# Patient Record
Sex: Male | Born: 1960 | Race: White | Hispanic: No | Smoking: Former smoker
Health system: Southern US, Community
[De-identification: ages and names within clinical notes are randomized; demographics above are authoritative.]

## PROBLEM LIST (undated history)

## (undated) DIAGNOSIS — J449 Chronic obstructive pulmonary disease, unspecified: Secondary | ICD-10-CM

## (undated) DIAGNOSIS — Z972 Presence of dental prosthetic device (complete) (partial): Secondary | ICD-10-CM

## (undated) DIAGNOSIS — I10 Essential (primary) hypertension: Secondary | ICD-10-CM

## (undated) DIAGNOSIS — M1990 Unspecified osteoarthritis, unspecified site: Secondary | ICD-10-CM

## (undated) HISTORY — PX: CATARACT EXTRACTION: SUR2

---

## 2007-04-27 ENCOUNTER — Emergency Department: Payer: Self-pay | Admitting: Internal Medicine

## 2008-02-03 ENCOUNTER — Emergency Department: Payer: Self-pay

## 2008-05-07 ENCOUNTER — Emergency Department: Payer: Self-pay | Admitting: Internal Medicine

## 2009-10-24 ENCOUNTER — Emergency Department: Payer: Self-pay | Admitting: Emergency Medicine

## 2011-03-24 ENCOUNTER — Emergency Department: Payer: Self-pay | Admitting: *Deleted

## 2011-03-26 ENCOUNTER — Emergency Department: Payer: Self-pay | Admitting: Emergency Medicine

## 2012-08-31 ENCOUNTER — Emergency Department: Payer: Self-pay | Admitting: Unknown Physician Specialty

## 2012-09-29 ENCOUNTER — Ambulatory Visit: Payer: Self-pay | Admitting: Anesthesiology

## 2012-10-02 ENCOUNTER — Ambulatory Visit: Payer: Self-pay | Admitting: Emergency Medicine

## 2012-11-04 ENCOUNTER — Emergency Department: Payer: Self-pay | Admitting: Emergency Medicine

## 2013-04-06 ENCOUNTER — Ambulatory Visit: Payer: Self-pay | Admitting: Gastroenterology

## 2013-09-21 ENCOUNTER — Ambulatory Visit: Payer: Self-pay | Admitting: Specialist

## 2013-09-21 LAB — APTT: Activated PTT: 29.1 secs (ref 23.6–35.9)

## 2013-09-21 LAB — URINALYSIS, COMPLETE
BLOOD: NEGATIVE
Bacteria: NONE SEEN
Bilirubin,UR: NEGATIVE
GLUCOSE, UR: NEGATIVE mg/dL (ref 0–75)
Ketone: NEGATIVE
LEUKOCYTE ESTERASE: NEGATIVE
Nitrite: NEGATIVE
PROTEIN: NEGATIVE
Ph: 6 (ref 4.5–8.0)
RBC,UR: 1 /HPF (ref 0–5)
Specific Gravity: 1.02 (ref 1.003–1.030)
Squamous Epithelial: NONE SEEN

## 2013-09-21 LAB — PROTIME-INR
INR: 1
PROTHROMBIN TIME: 13.3 s (ref 11.5–14.7)

## 2013-09-22 LAB — MRSA PCR SCREENING

## 2013-10-13 ENCOUNTER — Inpatient Hospital Stay: Payer: Self-pay | Admitting: Specialist

## 2013-10-14 LAB — HEMOGLOBIN: HGB: 13.7 g/dL (ref 13.0–18.0)

## 2013-10-14 LAB — CREATININE, SERUM: CREATININE: 0.82 mg/dL (ref 0.60–1.30)

## 2014-08-23 ENCOUNTER — Ambulatory Visit: Payer: Self-pay | Admitting: Gastroenterology

## 2014-10-08 NOTE — Consult Note (Signed)
PATIENT NAME:  Richard Gutierrez, Richard Gutierrez MR#:  [id number] DATE OF BIRTH:  August 29, 1960  DATE OF CONSULTATION:  11/04/2012  CONSULTING PHYSICIAN:  Delshawn Stech S. Phylis Bougie, MD  HISTORY OF PRESENT ILLNESS:  This patient was seen by me in the Emergency Room at request of Emergency Room physician. The patient came in with cellulitis and infection of  the umbilical area for the last 4 or 5 days. This patient had surgery performed for ventral hernia, which I put a mesh in over 3 weeks ago, and he was fine, so his stitches out. Then for the last 4 or 5 days, he was getting some increasing redness and swelling and some drainage, and he came to the Emergency Room. The emergency doctor called me because of the danger of infection of the mesh. I went and saw the patient. The patient did have cellulitis. I tried to drain the abscess. There was no fluid. I did the cultures and I put this patient on antibiotics, Bactrim DS twice a day.  I told him to come back and see me in the office.   ____________________________ Welford Roche. Phylis Bougie, MD msh:dmm D: 11/11/2012 10:58:00 ET T: 11/11/2012 11:24:24 ET JOB#: [id number]  cc: Perri Aragones S. Phylis Bougie, MD, <Dictator> Sharene Butters MD ELECTRONICALLY SIGNED 11/12/2012 11:45

## 2014-10-08 NOTE — Op Note (Signed)
PATIENT NAME:  Richard Gutierrez, Richard Gutierrez MR#:  [id number] DATE OF BIRTH:  01-03-1961  DATE OF PROCEDURE:  10/02/2012  PREOPERATIVE DIAGNOSIS:  Incarcerated ventral hernia.   POSTOPERATIVE DIAGNOSIS: Incarcerated ventral hernia.   PROCEDURE:  Repair of incarcerated ventral hernia with mesh.   SURGEON:  Vella Kohler, MD  INDICATION:  This patient was seen by me with a large incarcerated hernia, in the middle of his abdomen. He is an obese gentleman. He also has COPD.    DESCRIPTION OF PROCEDURE:  The patient was brought to surgery. Under general anesthesia, the abdomen was then prepped and draped. A small incision was made under the skin. After cutting skin and subcutaneous tissue, the hernia sac was then dissected off. The patient had a piece of omentum stuck through it. It was then dissected off, suture ligated and sent and pushed back into the abdomen. After that adhesions from the inside of the abdominal wall were then released to put mesh inside and then we put in a circular mesh in and sutured all across and sutured the fascia with interrupted #1 Surgilon.  The subcutaneous tissue was then closed with 3-0 Vicryl. The skin was closed with skin sutures. The patient tolerated the procedure well and was sent to the recovery room in satisfactory condition.  ____________________________ Welford Roche Phylis Bougie, MD msh:sb D: 10/02/2012 12:03:11 ET T: 10/02/2012 12:19:50 ET JOB#: [id number]  cc: Aviva Wolfer S. Phylis Bougie, MD, <Dictator> Sarah "Baltazar Apo, MD Sharene Butters MD ELECTRONICALLY SIGNED 10/16/2012 14:00

## 2014-10-09 NOTE — Op Note (Signed)
PATIENT NAME:  Richard Gutierrez, Richard Gutierrez MR#:  [id number] DATE OF BIRTH:  03/14/1961  DATE OF OPERATION:  10/13/2013  PREOPERATIVE DIAGNOSIS: Severe degenerative arthritis, left knee.   POSTOPERATIVE DIAGNOSIS: Severe degenerative arthritis, left knee.   PROCEDURE: LCS left total knee replacement.   SURGEON: Christophe Louis, M.D.   ANESTHESIA: Spinal.   COMPLICATIONS: None.   TOURNIQUET TIME: 115 minutes.   ESTIMATED BLOOD LOSS: 75 mL.   DRAINS: Two Autovac.   DESCRIPTION OF PROCEDURE: Clindamycin 600 mg is given intravenously. Spinal anesthesia is induced. Tourniquet is applied to the left leg. The left leg is thoroughly prepped with alcohol and ChloraPrep, and draped in standard sterile fashion. The extremity is wrapped out with the Esmarch bandage, and pneumatic tourniquet elevated to 350 mmHg. A standard anterior longitudinal incision is made for total knee replacement, and the dissection carried down to the patella, and the medial and lateral retinaculum is dissected out. Medial parapatellar incision is then made, and the knee is flexed and the patella reflected laterally. Standard synovial and osteophyte debridement is performed. Retractors are placed about the proximal tibia, and the proximal tibial cutter is put into place. This is checked with the alignment guide and seen to be satisfactory. Proximal tibial cut is then made. Posterior meniscus debridement is performed. The distal femur is sized as a standard plus. Central drill hole is then made using the guide. The central peg is then inserted into the distal femur. Femoral rotation guide is then put into place and is seen to be a good fit at 10 mm. The cutting block is pinned, and the anterior and posterior cuts are made. The 10 mm flexion block is put into place, and is seen to be stable, with excellent alignment. The 5-degree valgus distal femoral cutting block is then impacted into place, and then pinned. This is cut off at the appropriate  angle. The extension block is then put into place, and also seen to be in good position, but somewhat tight. One mm more of distal femur was taken off, and the ligament tightness and position of the block was excellent. This is removed, and the femoral shaping guide is used to drill the holes and remove the appropriate portions of bone. Retractors are then placed, revealing the proximal tibia, and the +3 tibial guide is impacted into place. A central drill hole is made. Pins are removed, and the central plug placed. The 10 mm rotating platform trial is inserted, along with the standard plus femur. There is seen to be full extension and flexion, without any particular problems. The patella is prepared in standard fashion for a press-fit patella. All trial components are then removed. The joint was thoroughly irrigated multiple times throughout the case. Final pulsatile lavage was performed. The +3 metallic tibial tray is then cemented into place along with a rotating platform 10 mm polyethylene component. The standard plus porous-coated femoral component is impacted into place with the small amount of cement in the intercondylar notch. The patella was press-fit. Final position of all components is excellent, with a good range of motion. The wound is thoroughly irrigated multiple times. The periarticular tissues are then infiltrated with a mixture of plain Marcaine, 4 mg of morphine, and Toradol. Two Autovac drains were brought out through separate stab wound incisions. The retinaculum is tightly repaired using #2 Ethibond. Subcutaneous tissue is closed with 2-0 Vicryl. Skin is closed with the skin stapler. A soft bulky dressing with Polar Care and a knee immobilizer is applied.  The tourniquet is released. The patient is returned to the recovery room in satisfactory condition, having tolerated the procedure quite well.     ____________________________ Lucas Mallow, MD ces:mr D: 10/13/2013 17:05:57  ET T: 10/13/2013 19:30:05 ET JOB#: [id number]  cc: Lucas Mallow, MD, <Dictator> Lucas Mallow MD ELECTRONICALLY SIGNED 10/15/2013 15:37

## 2014-10-09 NOTE — Discharge Summary (Signed)
PATIENT NAME:  Richard Gutierrez, Richard Gutierrez MR#:  [id number] DATE OF BIRTH:  1960/11/16  DATE OF ADMISSION:  10/13/2013 DATE OF DISCHARGE:  10/17/2013  DISCHARGE DIAGNOSIS: Severe degenerative arthritis, left knee.   OPERATIONS OR PROCEDURES PERFORMED: Left total knee arthroplasty on 10/13/2013.   HISTORY AND PHYSICAL EXAMINATION: As written on admission.   LABORATORY DATA: As noted in the chart.   HOSPITAL COURSE: Following appropriate informed consent and laboratory evaluation, the patient was taken to the Operating Room on 10/13/2013 where a left LCS total hip arthroplasty was performed without difficulty. The patient tolerated the procedure quite well and had a benign postoperative course. His Hemovac was removed on day number one and physical therapy was begun. Range of motion was begun at the same time. He was advanced in a walker with weight-bearing as tolerated. He progressed and his wound was seen to be healing in quite well. By 05/02/20105 it was felt that he could be safely discharged to his home on his previous home medications, plus Dilaudid 2 mg every three hours as necessary for pain and also enteric-coated aspirin 1 tablet 325 mg twice a day to be taken with food. Home health physical therapy was prescribed. He is to return to the office in 10 days for staple removal.   ____________________________ Lucas Mallow, MD ces:sg D: 11/04/2013 13:52:38 ET T: 11/04/2013 14:19:01 ET JOB#: [id number]  cc: Lucas Mallow, MD, <Dictator> Lucas Mallow MD ELECTRONICALLY SIGNED 11/07/2013 13:33

## 2017-08-12 ENCOUNTER — Other Ambulatory Visit: Payer: Self-pay

## 2017-08-12 ENCOUNTER — Encounter: Payer: Self-pay | Admitting: Emergency Medicine

## 2017-08-12 ENCOUNTER — Emergency Department
Admission: EM | Admit: 2017-08-12 | Discharge: 2017-08-12 | Disposition: A | Discharge status: Home / Self Care | Payer: Medicare HMO | Attending: Emergency Medicine | Admitting: Emergency Medicine

## 2017-08-12 DIAGNOSIS — Z79899 Other long term (current) drug therapy: Secondary | ICD-10-CM | POA: Insufficient documentation

## 2017-08-12 DIAGNOSIS — I10 Essential (primary) hypertension: Secondary | ICD-10-CM | POA: Insufficient documentation

## 2017-08-12 DIAGNOSIS — L02412 Cutaneous abscess of left axilla: Secondary | ICD-10-CM | POA: Diagnosis not present

## 2017-08-12 DIAGNOSIS — J449 Chronic obstructive pulmonary disease, unspecified: Secondary | ICD-10-CM | POA: Diagnosis not present

## 2017-08-12 DIAGNOSIS — Z87891 Personal history of nicotine dependence: Secondary | ICD-10-CM | POA: Diagnosis not present

## 2017-08-12 HISTORY — DX: Chronic obstructive pulmonary disease, unspecified: J44.9

## 2017-08-12 HISTORY — DX: Essential (primary) hypertension: I10

## 2017-08-12 MED ORDER — OXYCODONE HCL 5 MG PO TABS
5.0000 mg | ORAL_TABLET | Freq: Once | ORAL | Status: AC
  Administered 2017-08-12: 5 mg via ORAL
  Filled 2017-08-12: qty 1

## 2017-08-12 MED ORDER — SULFAMETHOXAZOLE-TRIMETHOPRIM 800-160 MG PO TABS: 1.0000 | ORAL_TABLET | Freq: Two times a day (BID) | ORAL | 0 refills | Status: DC

## 2017-08-12 MED ORDER — LIDOCAINE HCL (PF) 1 % IJ SOLN
5.0000 mL | Freq: Once | INTRAMUSCULAR | Status: AC
  Administered 2017-08-12: 5 mL via INTRADERMAL
  Filled 2017-08-12: qty 5

## 2017-08-12 MED ORDER — HYDROCODONE-ACETAMINOPHEN 5-325 MG PO TABS: 1.0000 | ORAL_TABLET | ORAL | 0 refills | Status: AC | PRN

## 2017-08-12 NOTE — ED Notes (Signed)
See triage note  Presents with a possible abscess area under left arm for about 3-4 days  Increased pain this am

## 2017-08-12 NOTE — Discharge Instructions (Signed)
Return to the ER in 2-3 days if not improving.

## 2017-08-12 NOTE — ED Provider Notes (Signed)
[facility] Emergency Department Provider Note  ____________________________________________  Time seen: Approximately 8:52 AM  I have reviewed the triage vital signs and the nursing notes.   HISTORY  Chief Complaint Abscess   HPI Richard Gutierrez is a 57 y.o. male who presents to the emergency department for treatment and evaluation of an abscess under the left axilla x 3 days.He has had an abscess under the right arm several years ago that required I&D, but none since. Pain has increased over the past 24 hours. No known fever. No alleviating measures have been attempted.  Past Medical History:  Diagnosis Date  . COPD (chronic obstructive pulmonary disease) ([place])   . Hypertension     There are no active problems to display for this patient.   Past Surgical History:  Procedure Laterality Date  . CATARACT EXTRACTION      Prior to Admission medications   Medication Sig Start Date End Date Taking? Authorizing Provider  albuterol (PROVENTIL HFA;VENTOLIN HFA) 108 (90 Base) MCG/ACT inhaler Inhale 2 puffs into the lungs every 6 (six) hours as needed for wheezing or shortness of breath.   Yes [provider]  atorvastatin (LIPITOR) 20 MG tablet Take 20 mg by mouth daily.   Yes [provider]  Fluticasone-Salmeterol (ADVAIR) 250-50 MCG/DOSE AEPB Inhale 1 puff into the lungs 2 (two) times daily.   Yes [provider]  lisinopril (PRINIVIL,ZESTRIL) 20 MG tablet Take 20 mg by mouth daily.   Yes [provider]  meloxicam (MOBIC) 7.5 MG tablet Take 7.5 mg by mouth daily.   Yes [provider]  tiotropium (SPIRIVA) 18 MCG inhalation capsule Place 18 mcg into inhaler and inhale daily.   Yes [provider]  HYDROcodone-acetaminophen (NORCO/VICODIN) 5-325 MG tablet Take 1 tablet by mouth every 4 (four) hours as needed for moderate pain. 08/12/17 08/12/18  Seyon Strader, Johnette Abraham B, FNP  sulfamethoxazole-trimethoprim (BACTRIM  DS,SEPTRA DS) 800-160 MG tablet Take 1 tablet by mouth 2 (two) times daily. 08/12/17   Victorino Dike, FNP    Allergies Patient has no known allergies.  No family history on file.  Social History Social History   Tobacco Use  . Smoking status: Former Research scientist (life sciences)  . Smokeless tobacco: Never Used  Substance Use Topics  . Alcohol use: No    Frequency: Never  . Drug use: Not on file    Review of Systems  Constitutional: Negative for fever. Respiratory: Negative for cough or shortness of breath.  Musculoskeletal: Negataive for myalgias Skin: Positive for abscess under the left axilla. Neurological: Positive for numbness or paresthesias. ____________________________________________   PHYSICAL EXAM:  VITAL SIGNS: ED Triage Vitals  Enc Vitals Group     BP 08/12/17 0839 (!) 149/74     Pulse Rate 08/12/17 0839 77     Resp 08/12/17 0839 18     Temp 08/12/17 0839 98 F (36.7 C)     Temp Source 08/12/17 0839 Oral     SpO2 08/12/17 0839 100 %     Weight 08/12/17 0840 280 lb (127 kg)     Height 08/12/17 0840 5\' 9"  (1.753 m)     Head Circumference --      Peak Flow --      Pain Score --      Pain Loc --      Pain Edu? --      Excl. in [place]? --      Constitutional: Well appearing. Eyes: Conjunctivae are clear without discharge or drainage.  Nose: No rhinorrhea noted. Mouth/Throat: Airway is patent.  Neck: No stridor. Unrestricted range of motion observed. Lymphatic: No palpable axillary nodes  Cardiovascular: Capillary refill is <3 seconds.  Respiratory: Respirations are even and unlabored.. Musculoskeletal: Unrestricted range of motion observed. Neurologic: Awake, alert, and oriented x 4.  Skin:  Fluctuant, tender, mildly erythematous area under left axilla.  ____________________________________________   LABS (all labs ordered are listed, but only abnormal results are displayed)  Labs Reviewed - No data to  display ____________________________________________  EKG  Not indicated. ____________________________________________  RADIOLOGY  Not indicated. ____________________________________________   PROCEDURES  .Marland KitchenIncision and Drainage Date/Time: 08/12/2017 8:59 AM Performed by: Victorino Dike, FNP Authorized by: Victorino Dike, FNP   Consent:    Consent obtained:  Verbal   Consent given by:  Patient   Risks discussed:  Bleeding, infection, incomplete drainage and pain   Alternatives discussed:  Alternative treatment, delayed treatment and observation Location:    Type:  Abscess Anesthesia (see MAR for exact dosages):    Anesthesia method:  Local infiltration   Local anesthetic:  Lidocaine 1% w/o epi Procedure type:    Complexity:  Complex Procedure details:    Incision types:  Stab incision   Scalpel blade:  11   Wound management:  Probed and deloculated   Drainage:  Bloody and purulent   Drainage amount:  Scant   Wound treatment:  Wound left open   Packing materials:  None Post-procedure details:    Patient tolerance of procedure:  Tolerated well, no immediate complications    ____________________________________________   INITIAL IMPRESSION / ASSESSMENT AND PLAN / ED COURSE  Richard Gutierrez is a 57 y.o. male who presents to the emergency department for treatment and evaluation of abscess under the left axilla.  He states that several days ago the area started as a small pimple and has now increased significantly in size.  He states that he had symptoms similar on the opposite underarm several years ago, but no recent infections.  Incision and drainage performed with scant result of purulent drainage.  No pocket was open enough to insert iodoform gauze.  Patient was instructed to take the Bactrim as prescribed until finished.  He was encouraged to follow-up with his primary care provider or return to the emergency department if not improving over the next 2-3  days.  Medications  oxyCODONE (Oxy IR/ROXICODONE) immediate release tablet 5 mg (5 mg Oral Given 08/12/17 0906)  lidocaine (PF) (XYLOCAINE) 1 % injection 5 mL (5 mLs Intradermal Given 08/12/17 1540)     Pertinent labs & imaging results that were available during my care of the patient were reviewed by me and considered in my medical decision making (see chart for details). ____________________________________________   FINAL CLINICAL IMPRESSION(S) / ED DIAGNOSES  Final diagnoses:  Abscess of axilla, left    ED Discharge Orders        Ordered    sulfamethoxazole-trimethoprim (BACTRIM DS,SEPTRA DS) 800-160 MG tablet  2 times daily     08/12/17 0950    HYDROcodone-acetaminophen (NORCO/VICODIN) 5-325 MG tablet  Every 4 hours PRN     08/12/17 0950       Note:  This document was prepared using Dragon voice recognition software and may include unintentional dictation errors.    Victorino Dike, FNP 08/12/17 1023    Lisa Roca, MD 08/12/17 [phone]

## 2017-08-12 NOTE — ED Triage Notes (Signed)
Pt c/o abscess under the left axillary since last Thursday.

## 2017-08-14 ENCOUNTER — Emergency Department
Admission: EM | Admit: 2017-08-14 | Discharge: 2017-08-14 | Disposition: A | Discharge status: Home / Self Care | Payer: Medicare HMO | Attending: Emergency Medicine | Admitting: Emergency Medicine

## 2017-08-14 ENCOUNTER — Other Ambulatory Visit: Payer: Self-pay

## 2017-08-14 ENCOUNTER — Encounter: Payer: Self-pay | Admitting: Emergency Medicine

## 2017-08-14 DIAGNOSIS — J449 Chronic obstructive pulmonary disease, unspecified: Secondary | ICD-10-CM | POA: Diagnosis not present

## 2017-08-14 DIAGNOSIS — I10 Essential (primary) hypertension: Secondary | ICD-10-CM | POA: Insufficient documentation

## 2017-08-14 DIAGNOSIS — Z79899 Other long term (current) drug therapy: Secondary | ICD-10-CM | POA: Diagnosis not present

## 2017-08-14 DIAGNOSIS — Z09 Encounter for follow-up examination after completed treatment for conditions other than malignant neoplasm: Secondary | ICD-10-CM | POA: Diagnosis present

## 2017-08-14 DIAGNOSIS — Z87891 Personal history of nicotine dependence: Secondary | ICD-10-CM | POA: Diagnosis not present

## 2017-08-14 MED ORDER — CLINDAMYCIN HCL 150 MG PO CAPS: 150.0000 mg | ORAL_CAPSULE | Freq: Four times a day (QID) | ORAL | 0 refills | Status: DC

## 2017-08-14 NOTE — ED Provider Notes (Signed)
[facility] Emergency Department Provider Note   ____________________________________________   First MD Initiated Contact with Patient 08/14/17 [phone]     (approximate)  I have reviewed the triage vital signs and the nursing notes.   HISTORY  Chief Complaint Wound Check    HPI Richard Gutierrez is a 57 y.o. male patient reports for follow-up of incision and drainage of the left axillary abscess 2 days ago.  Patient states area has not improved or worsened since his initial visit.  Patient state no drainage from area.  Review of records reveals scant amount of drainage 2 days ago.  Patient rates the pain as 8/10.  Patient described the pain is "achy".  Past Medical History:  Diagnosis Date  . COPD (chronic obstructive pulmonary disease) ([place])   . Hypertension     There are no active problems to display for this patient.   Past Surgical History:  Procedure Laterality Date  . CATARACT EXTRACTION      Prior to Admission medications   Medication Sig Start Date End Date Taking? Authorizing Provider  albuterol (PROVENTIL HFA;VENTOLIN HFA) 108 (90 Base) MCG/ACT inhaler Inhale 2 puffs into the lungs every 6 (six) hours as needed for wheezing or shortness of breath.    [provider]  atorvastatin (LIPITOR) 20 MG tablet Take 20 mg by mouth daily.    [provider]  clindamycin (CLEOCIN) 150 MG capsule Take 1 capsule (150 mg total) by mouth 4 (four) times daily. 08/14/17   Sable Feil, PA-C  Fluticasone-Salmeterol (ADVAIR) 250-50 MCG/DOSE AEPB Inhale 1 puff into the lungs 2 (two) times daily.    [provider]  HYDROcodone-acetaminophen (NORCO/VICODIN) 5-325 MG tablet Take 1 tablet by mouth every 4 (four) hours as needed for moderate pain. 08/12/17 08/12/18  Triplett, Johnette Abraham B, FNP  lisinopril (PRINIVIL,ZESTRIL) 20 MG tablet Take 20 mg by mouth daily.    [provider]  meloxicam (MOBIC) 7.5 MG tablet Take 7.5 mg by mouth  daily.    [provider]  sulfamethoxazole-trimethoprim (BACTRIM DS,SEPTRA DS) 800-160 MG tablet Take 1 tablet by mouth 2 (two) times daily. 08/12/17   Triplett, Johnette Abraham B, FNP  tiotropium (SPIRIVA) 18 MCG inhalation capsule Place 18 mcg into inhaler and inhale daily.    [provider]    Allergies Patient has no known allergies.  History reviewed. No pertinent family history.  Social History Social History   Tobacco Use  . Smoking status: Former Research scientist (life sciences)  . Smokeless tobacco: Never Used  Substance Use Topics  . Alcohol use: No    Frequency: Never  . Drug use: Not on file    Review of Systems Constitutional: No fever/chills Eyes: No visual changes. ENT: No sore throat. Cardiovascular: Denies chest pain. Respiratory: Denies shortness of breath. Gastrointestinal: No abdominal pain.  No nausea, no vomiting.  No diarrhea.  No constipation. Genitourinary: Negative for dysuria. Musculoskeletal: Negative for back pain. Skin: Positive for abscess. Neurological: Negative for headaches, focal weakness or numbness. Endocrine:Hypertension  ____________________________________________   PHYSICAL EXAM:  VITAL SIGNS: ED Triage Vitals  Enc Vitals Group     BP 08/14/17 0842 (!) 146/69     Pulse Rate 08/14/17 0842 75     Resp 08/14/17 0842 20     Temp 08/14/17 0842 97.7 F (36.5 C)     Temp Source 08/14/17 0842 Oral     SpO2 08/14/17 0842 95 %     Weight 08/14/17 0843 280 lb (127 kg)  Height 08/14/17 0843 5\' 9"  (1.753 m)     Head Circumference --      Peak Flow --      Pain Score 08/14/17 0848 8     Pain Loc --      Pain Edu? --      Excl. in [place]? --    Constitutional: Alert and oriented. Well appearing and in no acute distress. Cardiovascular: Normal rate, regular rhythm. Grossly normal heart sounds.  Good peripheral circulation. Respiratory: Normal respiratory effort.  No retractions. Lungs CTAB. Neurologic:  Normal speech and language. No gross focal  neurologic deficits are appreciated. No gait instability. Skin: Nonfluctuant nodule lesion left axillary area.   Psychiatric: Mood and affect are normal. Speech and behavior are normal.  ____________________________________________   LABS (all labs ordered are listed, but only abnormal results are displayed)  Labs Reviewed - No data to display ____________________________________________  EKG   ____________________________________________  RADIOLOGY  ED MD interpretation:    Official radiology report(s): No results found.  ____________________________________________   PROCEDURES  Procedure(s) performed: None  Procedures  Critical Care performed: No  ____________________________________________   INITIAL IMPRESSION / ASSESSMENT AND PLAN / ED COURSE  As part of my medical decision making, I reviewed the following data within the electronic MEDICAL RECORD NUMBER    Left axilla abscess.  Discussed with patient rationale for not repeating incision and drainage of this nonfluctuant lesion.  Advised to continue previous medication and start clindamycin as directed.  Advised warm compresses to area twice a day for 5-10 minutes.  Advised patient he may now follow-up with PCP for continued care.      ____________________________________________   FINAL CLINICAL IMPRESSION(S) / ED DIAGNOSES  Final diagnoses:  Encounter for recheck of abscess following incision and drainage     ED Discharge Orders        Ordered    clindamycin (CLEOCIN) 150 MG capsule  4 times daily     08/14/17 0902       Note:  This document was prepared using Dragon voice recognition software and may include unintentional dictation errors.    Sable Feil, PA-C 08/14/17 8242    Lisa Roca, MD 08/15/17 [phone]

## 2017-08-14 NOTE — ED Triage Notes (Signed)
Here for wound recheck.  Has I&D to abscess in left axilla 2 days ago.  Has been taking abx.  Still has induration present without redness.  No known fevers.

## 2017-08-14 NOTE — Discharge Instructions (Signed)
Continue previous medication and start clindamycin as directed.  Apply warm compresses to area twice a day for 5-10 minutes.  May follow-up with PCP for continued care.  Be sure to take all of the antibiotics as directed until finished.

## 2018-01-22 ENCOUNTER — Telehealth: Payer: Self-pay | Admitting: *Deleted

## 2018-01-22 DIAGNOSIS — Z122 Encounter for screening for malignant neoplasm of respiratory organs: Secondary | ICD-10-CM

## 2018-01-22 DIAGNOSIS — Z87891 Personal history of nicotine dependence: Secondary | ICD-10-CM

## 2018-01-22 NOTE — Telephone Encounter (Signed)
Received referral for initial lung cancer screening scan. Contacted patient and obtained smoking history,(former, quit 5/19, 92 pack year) as well as answering questions related to screening process. Patient denies signs of lung cancer such as weight loss or hemoptysis. Patient denies comorbidity that would prevent curative treatment if lung cancer were found. Patient is scheduled for shared decision making visit and CT scan on 02/07/18.

## 2018-02-07 ENCOUNTER — Inpatient Hospital Stay: Payer: Medicare HMO | Admitting: Oncology

## 2018-02-07 ENCOUNTER — Ambulatory Visit: Payer: Medicare HMO

## 2018-02-25 ENCOUNTER — Inpatient Hospital Stay: Payer: Medicare HMO | Attending: Oncology | Admitting: Oncology

## 2018-02-25 ENCOUNTER — Ambulatory Visit
Admission: RE | Admit: 2018-02-25 | Discharge: 2018-02-25 | Disposition: A | Discharge status: Home / Self Care | Payer: Medicare HMO | Source: Ambulatory Visit | Attending: Oncology | Admitting: Oncology

## 2018-02-25 ENCOUNTER — Ambulatory Visit: Payer: Medicare HMO

## 2018-02-25 DIAGNOSIS — J439 Emphysema, unspecified: Secondary | ICD-10-CM | POA: Insufficient documentation

## 2018-02-25 DIAGNOSIS — Z122 Encounter for screening for malignant neoplasm of respiratory organs: Secondary | ICD-10-CM | POA: Diagnosis present

## 2018-02-25 DIAGNOSIS — Z87891 Personal history of nicotine dependence: Secondary | ICD-10-CM

## 2018-02-25 NOTE — Progress Notes (Signed)
In accordance with CMS guidelines, patient has met eligibility criteria including age, absence of signs or symptoms of lung cancer.  Social History   Tobacco Use  . Smoking status: Former Research scientist (life sciences)  . Smokeless tobacco: Never Used  Substance Use Topics  . Alcohol use: No    Frequency: Never  . Drug use: Not on file     A shared decision-making session was conducted prior to the performance of CT scan. This includes one or more decision aids, includes benefits and harms of screening, follow-up diagnostic testing, over-diagnosis, false positive rate, and total radiation exposure.  Counseling on the importance of adherence to annual lung cancer LDCT screening, impact of co-morbidities, and ability or willingness to undergo diagnosis and treatment is imperative for compliance of the program.  Counseling on the importance of continued smoking cessation for former smokers; the importance of smoking cessation for current smokers, and information about tobacco cessation interventions have been given to patient including [place] and [employer] quit Tool programs.  Written order for lung cancer screening with LDCT has been given to the patient and any and all questions have been answered to the best of my abilities.   Yearly follow up will be coordinated by Burgess Estelle, Thoracic Navigator.  Faythe Casa, NP 02/25/2018 3:38 PM

## 2018-02-27 ENCOUNTER — Encounter: Payer: Self-pay | Admitting: *Deleted

## 2018-08-14 ENCOUNTER — Telehealth: Payer: Self-pay

## 2018-08-14 ENCOUNTER — Other Ambulatory Visit: Payer: Self-pay

## 2018-08-14 DIAGNOSIS — Z1211 Encounter for screening for malignant neoplasm of colon: Secondary | ICD-10-CM

## 2018-08-14 NOTE — Telephone Encounter (Signed)
Gastroenterology Pre-Procedure Review  Request Date: 09/11/18 Requesting Physician: Dr. Allen Norris  PATIENT REVIEW QUESTIONS: The patient responded to the following health history questions as indicated:    1. Are you having any GI issues? no 2. Do you have a personal history of Polyps? yes unsure of year 3. Do you have a family history of Colon Cancer or Polyps? no 4. Diabetes Mellitus? no 5. Joint replacements in the past 12 months?no 6. Major health problems in the past 3 months?no 7. Any artificial heart valves, MVP, or defibrillator?no    MEDICATIONS & ALLERGIES:    Patient reports the following regarding taking any anticoagulation/antiplatelet therapy:   Plavix, Coumadin, Eliquis, Xarelto, Lovenox, Pradaxa, Brilinta, or Effient? no Aspirin? no  Patient confirms/reports the following medications:  Current Outpatient Medications  Medication Sig Dispense Refill  . albuterol (PROVENTIL HFA;VENTOLIN HFA) 108 (90 Base) MCG/ACT inhaler Inhale 2 puffs into the lungs every 6 (six) hours as needed for wheezing or shortness of breath.    Marland Kitchen atorvastatin (LIPITOR) 20 MG tablet Take 20 mg by mouth daily.    . clindamycin (CLEOCIN) 150 MG capsule Take 1 capsule (150 mg total) by mouth 4 (four) times daily. 40 capsule 0  . Fluticasone-Salmeterol (ADVAIR) 250-50 MCG/DOSE AEPB Inhale 1 puff into the lungs 2 (two) times daily.    Marland Kitchen lisinopril (PRINIVIL,ZESTRIL) 20 MG tablet Take 20 mg by mouth daily.    . meloxicam (MOBIC) 7.5 MG tablet Take 7.5 mg by mouth daily.    Marland Kitchen sulfamethoxazole-trimethoprim (BACTRIM DS,SEPTRA DS) 800-160 MG tablet Take 1 tablet by mouth 2 (two) times daily. 20 tablet 0  . tiotropium (SPIRIVA) 18 MCG inhalation capsule Place 18 mcg into inhaler and inhale daily.     No current facility-administered medications for this visit.     Patient confirms/reports the following allergies:  No Known Allergies  No orders of the defined types were placed in this  encounter.   AUTHORIZATION INFORMATION Primary Insurance: 1D#: Group #:  Secondary Insurance: 1D#: Group #:  SCHEDULE INFORMATION: Date: 09/11/18 Time: Location:[facility]

## 2018-09-08 ENCOUNTER — Telehealth: Payer: Self-pay

## 2018-09-08 NOTE — Telephone Encounter (Signed)
Patient has been notified that due to [place] [address] his colonoscopy scheduled with Dr. Allen Norris on 09/11/18 has been canceled and we will call him back once restrictions have been lifted.  Thanks [employer]

## 2018-09-11 ENCOUNTER — Encounter: Admission: RE | Payer: Self-pay | Source: Home / Self Care

## 2018-09-11 ENCOUNTER — Ambulatory Visit: Admission: RE | Admit: 2018-09-11 | Payer: Medicare HMO | Source: Home / Self Care | Admitting: Gastroenterology

## 2018-09-11 SURGERY — COLONOSCOPY WITH PROPOFOL
Anesthesia: Choice

## 2018-09-15 ENCOUNTER — Telehealth: Payer: Self-pay

## 2018-09-15 ENCOUNTER — Other Ambulatory Visit: Payer: Self-pay

## 2018-09-15 DIAGNOSIS — Z1211 Encounter for screening for malignant neoplasm of colon: Secondary | ICD-10-CM

## 2018-09-15 NOTE — Telephone Encounter (Signed)
Contacted patient to reschedule his colonoscopy to July.  He has been rescheduled for July 10th with Dr. Allen Norris at [facility].  New instructions will be mailed, and order has been placed.  Referral update.  Thanks [employer]

## 2018-12-22 ENCOUNTER — Encounter: Payer: Self-pay | Admitting: *Deleted

## 2018-12-22 ENCOUNTER — Other Ambulatory Visit: Payer: Self-pay

## 2018-12-23 ENCOUNTER — Other Ambulatory Visit
Admission: RE | Admit: 2018-12-23 | Discharge: 2018-12-23 | Disposition: A | Discharge status: Home / Self Care | Payer: Medicare HMO | Source: Ambulatory Visit | Attending: Gastroenterology | Admitting: Gastroenterology

## 2018-12-23 DIAGNOSIS — Z1159 Encounter for screening for other viral diseases: Secondary | ICD-10-CM | POA: Diagnosis not present

## 2018-12-23 DIAGNOSIS — Z01812 Encounter for preprocedural laboratory examination: Secondary | ICD-10-CM | POA: Diagnosis present

## 2018-12-23 LAB — SARS CORONAVIRUS 2 (TAT 6-24 HRS): SARS Coronavirus 2: NEGATIVE

## 2018-12-25 NOTE — Discharge Instructions (Signed)

## 2018-12-26 ENCOUNTER — Encounter
Admission: RE | Disposition: A | Discharge status: Home / Self Care | Payer: Self-pay | Source: Home / Self Care | Attending: Gastroenterology

## 2018-12-26 ENCOUNTER — Ambulatory Visit: Payer: Medicare HMO | Admitting: Anesthesiology

## 2018-12-26 ENCOUNTER — Ambulatory Visit
Admission: RE | Admit: 2018-12-26 | Discharge: 2018-12-26 | Disposition: A | Discharge status: Home / Self Care | Payer: Medicare HMO | Attending: Gastroenterology | Admitting: Gastroenterology

## 2018-12-26 DIAGNOSIS — K640 First degree hemorrhoids: Secondary | ICD-10-CM | POA: Insufficient documentation

## 2018-12-26 DIAGNOSIS — Z860100 Personal history of colon polyps, unspecified: Secondary | ICD-10-CM

## 2018-12-26 DIAGNOSIS — Z791 Long term (current) use of non-steroidal anti-inflammatories (NSAID): Secondary | ICD-10-CM | POA: Insufficient documentation

## 2018-12-26 DIAGNOSIS — D122 Benign neoplasm of ascending colon: Secondary | ICD-10-CM | POA: Insufficient documentation

## 2018-12-26 DIAGNOSIS — Z79899 Other long term (current) drug therapy: Secondary | ICD-10-CM | POA: Insufficient documentation

## 2018-12-26 DIAGNOSIS — I10 Essential (primary) hypertension: Secondary | ICD-10-CM | POA: Insufficient documentation

## 2018-12-26 DIAGNOSIS — Z1211 Encounter for screening for malignant neoplasm of colon: Secondary | ICD-10-CM

## 2018-12-26 DIAGNOSIS — Z6841 Body Mass Index (BMI) 40.0 and over, adult: Secondary | ICD-10-CM | POA: Insufficient documentation

## 2018-12-26 DIAGNOSIS — M1990 Unspecified osteoarthritis, unspecified site: Secondary | ICD-10-CM | POA: Insufficient documentation

## 2018-12-26 DIAGNOSIS — K635 Polyp of colon: Secondary | ICD-10-CM

## 2018-12-26 DIAGNOSIS — J449 Chronic obstructive pulmonary disease, unspecified: Secondary | ICD-10-CM | POA: Diagnosis not present

## 2018-12-26 DIAGNOSIS — Z87891 Personal history of nicotine dependence: Secondary | ICD-10-CM | POA: Diagnosis not present

## 2018-12-26 DIAGNOSIS — Z09 Encounter for follow-up examination after completed treatment for conditions other than malignant neoplasm: Secondary | ICD-10-CM | POA: Diagnosis present

## 2018-12-26 HISTORY — DX: Unspecified osteoarthritis, unspecified site: M19.90

## 2018-12-26 HISTORY — PX: POLYPECTOMY: SHX5525

## 2018-12-26 HISTORY — DX: Presence of dental prosthetic device (complete) (partial): Z97.2

## 2018-12-26 HISTORY — PX: COLONOSCOPY WITH PROPOFOL: SHX5780

## 2018-12-26 SURGERY — COLONOSCOPY WITH PROPOFOL
Anesthesia: General | Site: Rectum

## 2018-12-26 MED ORDER — PROPOFOL 10 MG/ML IV BOLUS
INTRAVENOUS | Status: DC | PRN
  Administered 2018-12-26 (×2): 50 mg via INTRAVENOUS
  Administered 2018-12-26: 100 mg via INTRAVENOUS
  Administered 2018-12-26 (×2): 50 mg via INTRAVENOUS

## 2018-12-26 MED ORDER — LIDOCAINE HCL (CARDIAC) PF 100 MG/5ML IV SOSY
PREFILLED_SYRINGE | INTRAVENOUS | Status: DC | PRN
  Administered 2018-12-26: 50 mg via INTRAVENOUS

## 2018-12-26 MED ORDER — LACTATED RINGERS IV SOLN
INTRAVENOUS | Status: DC
  Administered 2018-12-26: 09:00:00 via INTRAVENOUS

## 2018-12-26 MED ORDER — SODIUM CHLORIDE 0.9 % IV SOLN: INTRAVENOUS | Status: DC

## 2018-12-26 MED ORDER — STERILE WATER FOR IRRIGATION IR SOLN
Status: DC | PRN
  Administered 2018-12-26: .05 mL

## 2018-12-26 SURGICAL SUPPLY — 6 items
CANISTER SUCT 1200ML W/VALVE (MISCELLANEOUS) ×4 IMPLANT
FORCEPS BIOP RAD 4 LRG CAP 4 (CUTTING FORCEPS) ×4 IMPLANT
GOWN CVR UNV OPN BCK APRN NK (MISCELLANEOUS) ×4 IMPLANT
GOWN ISOL THUMB LOOP REG UNIV (MISCELLANEOUS) ×4
KIT ENDO PROCEDURE OLY (KITS) ×4 IMPLANT
WATER STERILE IRR 250ML POUR (IV SOLUTION) ×4 IMPLANT

## 2018-12-26 NOTE — Op Note (Addendum)
[facility] Gastroenterology Patient Name: Richard Gutierrez Procedure Date: 12/26/2018 9:32 AM MRN: [id number] Account #: [id number] Date of Birth: [birth year] Admit Type: Outpatient Age: 58 Room: [facility] OR ROOM 01 Gender: Male Note Status: Finalized Procedure:            Colonoscopy Indications:          High risk colon cancer surveillance: Personal history                        of colonic polyps Providers:            Lucilla Lame MD, MD Referring MD:         Denton Lank MD, MD (Referring MD) Medicines:            Propofol per Anesthesia Complications:        No immediate complications. Procedure:            Pre-Anesthesia Assessment:                       - Prior to the procedure, a History and Physical was                        performed, and patient medications and allergies were                        reviewed. The patient's tolerance of previous                        anesthesia was also reviewed. The risks and benefits of                        the procedure and the sedation options and risks were                        discussed with the patient. All questions were                        answered, and informed consent was obtained. Prior                        Anticoagulants: The patient has taken no previous                        anticoagulant or antiplatelet agents. ASA Grade                        Assessment: II - A patient with mild systemic disease.                        After reviewing the risks and benefits, the patient was                        deemed in satisfactory condition to undergo the                        procedure.                       After obtaining informed consent, the colonoscope was  passed under direct vision. Throughout the procedure,                        the patient's blood pressure, pulse, and oxygen                        saturations were monitored continuously. The was                        introduced  through the anus and advanced to the the                        cecum, identified by appendiceal orifice and ileocecal                        valve. The colonoscopy was performed without                        difficulty. The patient tolerated the procedure well.                        The quality of the bowel preparation was excellent. Findings:      The perianal and digital rectal examinations were normal.      A 2 mm polyp was found in the ascending colon. The polyp was sessile.       The polyp was removed with a cold biopsy forceps. Resection and       retrieval were complete.      Non-bleeding internal hemorrhoids were found during retroflexion. The       hemorrhoids were Grade I (internal hemorrhoids that do not prolapse). Impression:           - One 2 mm polyp in the ascending colon, removed with a                        cold biopsy forceps. Resected and retrieved.                       - Non-bleeding internal hemorrhoids. Recommendation:       - Discharge patient to home.                       - Resume previous diet.                       - Continue present medications.                       - Repeat colonoscopy in 5 years for surveillance. Procedure Code(s):    --- Professional ---                       [phone], Colonoscopy, flexible; with biopsy, single or                        multiple Diagnosis Code(s):    --- Professional ---                       Z86.010, Personal history of colonic polyps                       K63.5, Polyp  of colon CPT copyright 2019 American Medical Association. All rights reserved. The codes documented in this report are preliminary and upon coder review may  be revised to meet current compliance requirements. Lucilla Lame MD, MD 12/26/2018 9:53:02 AM This report has been signed electronically. Number of Addenda: 0 Note Initiated On: 12/26/2018 9:32 AM Scope Withdrawal Time: 0 hours 6 minutes 53 seconds  Total Procedure Duration: 0 hours 9 minutes 35  seconds  Estimated Blood Loss: Estimated blood loss: none.      [facility]

## 2018-12-26 NOTE — Anesthesia Postprocedure Evaluation (Signed)
Anesthesia Post Note  Patient: Richard Gutierrez  Procedure(s) Performed: COLONOSCOPY WITH PROPOFOL (N/A Rectum) POLYPECTOMY (Rectum)  Patient location during evaluation: PACU Anesthesia Type: General Level of consciousness: awake Pain management: pain level controlled Vital Signs Assessment: post-procedure vital signs reviewed and stable Respiratory status: respiratory function stable Cardiovascular status: stable Postop Assessment: no signs of nausea or vomiting Anesthetic complications: no    Veda Canning

## 2018-12-26 NOTE — Transfer of Care (Signed)
Immediate Anesthesia Transfer of Care Note  Patient: Richard Gutierrez  Procedure(s) Performed: COLONOSCOPY WITH PROPOFOL (N/A Rectum) POLYPECTOMY (Rectum)  Patient Location: PACU  Anesthesia Type: General  Level of Consciousness: awake, alert  and patient cooperative  Airway and Oxygen Therapy: Patient Spontanous Breathing and Patient connected to supplemental oxygen  Post-op Assessment: Post-op Vital signs reviewed, Patient's Cardiovascular Status Stable, Respiratory Function Stable, Patent Airway and No signs of Nausea or vomiting  Post-op Vital Signs: Reviewed and stable  Complications: No apparent anesthesia complications

## 2018-12-26 NOTE — H&P (Signed)
Lucilla Lame, MD [address] [place], [place] [address] [phone] Fax : [phone]  Primary Care Physician:  Denton Lank, MD Primary Gastroenterologist:  Dr. Allen Norris  Pre-Procedure History & Physical: HPI:  Richard Gutierrez is a 58 y.o. male is here for an colonoscopy.   Past Medical History:  Diagnosis Date  . Arthritis   . COPD (chronic obstructive pulmonary disease) ([place])   . Hypertension   . Wears dentures    full upper and lower    Past Surgical History:  Procedure Laterality Date  . CATARACT EXTRACTION      Prior to Admission medications   Medication Sig Start Date End Date Taking? Authorizing Provider  albuterol (PROVENTIL HFA;VENTOLIN HFA) 108 (90 Base) MCG/ACT inhaler Inhale 2 puffs into the lungs every 6 (six) hours as needed for wheezing or shortness of breath.   Yes [provider]  atorvastatin (LIPITOR) 20 MG tablet Take 20 mg by mouth daily.   Yes [provider]  Fluticasone-Umeclidin-Vilant (TRELEGY ELLIPTA IN) Inhale into the lungs daily.   Yes [provider]  lisinopril (PRINIVIL,ZESTRIL) 20 MG tablet Take 20 mg by mouth daily.   Yes [provider]  meloxicam (MOBIC) 7.5 MG tablet Take 7.5 mg by mouth daily.   Yes [provider]  OXYGEN Inhale into the lungs. Uses oxygen concentrator at night   Yes [provider]    Allergies as of 09/15/2018  . (No Known Allergies)    History reviewed. No pertinent family history.  Social History   Socioeconomic History  . Marital status: Widowed    Spouse name: Not on file  . Number of children: Not on file  . Years of education: Not on file  . Highest education level: Not on file  Occupational History  . Not on file  Social Needs  . Financial resource strain: Not on file  . Food insecurity    Worry: Not on file    Inability: Not on file  . Transportation needs    Medical: Not on file    Non-medical: Not on file  Tobacco  Use  . Smoking status: Former Smoker    Packs/day: 1.50    Years: 45.00    Pack years: 67.50    Types: Cigarettes    Quit date: 09/17/2018    Years since quitting: 0.2  . Smokeless tobacco: Never Used  Substance and Sexual Activity  . Alcohol use: No    Frequency: Never    Comment: may have a drink 1-2x/month  . Drug use: Not on file  . Sexual activity: Not on file  Lifestyle  . Physical activity    Days per week: Not on file    Minutes per session: Not on file  . Stress: Not on file  Relationships  . Social Herbalist on phone: Not on file    Gets together: Not on file    Attends religious service: Not on file    Active member of club or organization: Not on file    Attends meetings of clubs or organizations: Not on file    Relationship status: Not on file  . Intimate partner violence    Fear of current or ex partner: Not on file    Emotionally abused: Not on file    Physically abused: Not on file    Forced sexual activity: Not on file  Other Topics Concern  . Not on file  Social History Narrative  .  Not on file    Review of Systems: See HPI, otherwise negative ROS  Physical Exam: BP (!) 158/75   Pulse 68   Temp 97.9 F (36.6 C) (Temporal)   Resp 16   Ht 5\' 9"  (1.753 m)   Wt 136.1 kg   SpO2 97%   BMI 44.30 kg/m  General:   Alert,  pleasant and cooperative in NAD Head:  Normocephalic and atraumatic. Neck:  Supple; no masses or thyromegaly. Lungs:  Clear throughout to auscultation.    Heart:  Regular rate and rhythm. Abdomen:  Soft, nontender and nondistended. Normal bowel sounds, without guarding, and without rebound.   Neurologic:  Alert and  oriented x4;  grossly normal neurologically.  Impression/Plan: Richard Gutierrez is here for an colonoscopy to be performed for history of colon polyps 08/23/2014  Risks, benefits, limitations, and alternatives regarding  colonoscopy have been reviewed with the patient.  Questions have been answered.  All  parties agreeable.   Lucilla Lame, MD  12/26/2018, 9:07 AM

## 2018-12-26 NOTE — Anesthesia Procedure Notes (Signed)
Procedure Name: MAC Date/Time: 12/26/2018 9:37 AM Performed by: Georga Bora, CRNA Pre-anesthesia Checklist: Patient identified, Emergency Drugs available, Suction available, Patient being monitored and Timeout performed Oxygen Delivery Method: Nasal cannula

## 2018-12-26 NOTE — Anesthesia Preprocedure Evaluation (Signed)
Anesthesia Evaluation  Patient identified by MRN, date of birth, ID band Patient awake    Reviewed: Allergy & Precautions, NPO status , Patient's Chart, lab work & pertinent test results  Airway Mallampati: II  TM Distance: >3 FB     Dental  (+) Upper Dentures, Lower Dentures   Pulmonary COPD, former smoker (quit 09/17/2018),    breath sounds clear to auscultation       Cardiovascular hypertension,  Rhythm:Regular Rate:Normal  HLD   Neuro/Psych    GI/Hepatic   Endo/Other  Morbid obesity (BMI 44 )  Renal/GU      Musculoskeletal  (+) Arthritis ,   Abdominal (+) + obese,   Peds  Hematology   Anesthesia Other Findings   Reproductive/Obstetrics                             Anesthesia Physical Anesthesia Plan  ASA: III  Anesthesia Plan: General   Post-op Pain Management:    Induction: Intravenous  PONV Risk Score and Plan:   Airway Management Planned: Nasal Cannula and Natural Airway  Additional Equipment:   Intra-op Plan:   Post-operative Plan:   Informed Consent: I have reviewed the patients History and Physical, chart, labs and discussed the procedure including the risks, benefits and alternatives for the proposed anesthesia with the patient or authorized representative who has indicated his/her understanding and acceptance.       Plan Discussed with: CRNA  Anesthesia Plan Comments:         Anesthesia Quick Evaluation

## 2018-12-29 ENCOUNTER — Encounter: Payer: Self-pay | Admitting: Gastroenterology

## 2018-12-30 ENCOUNTER — Encounter: Payer: Self-pay | Admitting: Gastroenterology

## 2019-03-02 ENCOUNTER — Telehealth: Payer: Self-pay | Admitting: *Deleted

## 2019-03-02 DIAGNOSIS — Z87891 Personal history of nicotine dependence: Secondary | ICD-10-CM

## 2019-03-02 DIAGNOSIS — Z122 Encounter for screening for malignant neoplasm of respiratory organs: Secondary | ICD-10-CM

## 2019-03-02 NOTE — Telephone Encounter (Signed)
Patient has been notified that lung cancer screening CT scan is currently due or will be in the near future. Confirmed that patient is within the appropriate age range and asymptomatic (no signs or symptoms of lung cancer). Patient denies illness that would prevent curative treatment for lung cancer if found. Verified smoking history (former smoker 1.5 ppd). Patient is agreeable for CT scan being scheduled, he has no preference for date or time.

## 2019-03-03 ENCOUNTER — Telehealth: Payer: Self-pay | Admitting: *Deleted

## 2019-03-03 NOTE — Telephone Encounter (Signed)
Former smoker, quit 10/2017, 92 pack year

## 2019-03-03 NOTE — Addendum Note (Signed)
Addended by: Lieutenant Diego on: 03/03/2019 12:34 PM   Modules accepted: Orders

## 2019-03-03 NOTE — Telephone Encounter (Signed)
error 

## 2019-03-23 ENCOUNTER — Ambulatory Visit
Admission: RE | Admit: 2019-03-23 | Discharge: 2019-03-23 | Disposition: A | Discharge status: Home / Self Care | Payer: Medicare HMO | Source: Ambulatory Visit | Attending: Oncology | Admitting: Oncology

## 2019-03-23 ENCOUNTER — Other Ambulatory Visit: Payer: Self-pay

## 2019-03-23 ENCOUNTER — Ambulatory Visit: Admission: RE | Admit: 2019-03-23 | Payer: Medicare HMO | Source: Ambulatory Visit

## 2019-03-23 DIAGNOSIS — Z87891 Personal history of nicotine dependence: Secondary | ICD-10-CM | POA: Diagnosis present

## 2019-03-23 DIAGNOSIS — Z122 Encounter for screening for malignant neoplasm of respiratory organs: Secondary | ICD-10-CM | POA: Diagnosis not present

## 2019-03-25 ENCOUNTER — Encounter: Payer: Self-pay | Admitting: *Deleted

## 2019-10-02 IMAGING — CT CT CHEST LUNG CANCER SCREENING LOW DOSE W/O CM
2 of 5 series · 15 of 40 positions shown, 18 images · non-contrast
Comparison: None.

CLINICAL DATA: Ex-smoker, quitting in [DATE] pack-year history.

EXAM:
CT CHEST WITHOUT CONTRAST LOW-DOSE FOR LUNG CANCER SCREENING
TECHNIQUE: Multidetector CT imaging of the chest was performed following the
standard protocol without IV contrast.

[Series 3: lung · axial · 0.81mm/px · z∈[-1277,-959]mm · 12 of 352 slices shown, 15 images (1 of 2)]
[im 17/352  mediastinal]
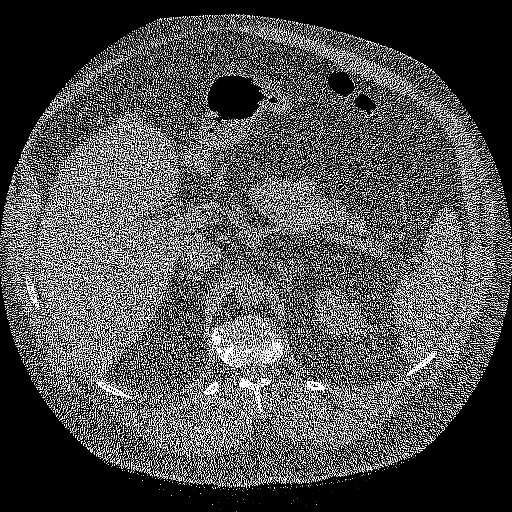
[im 17/352  lung]
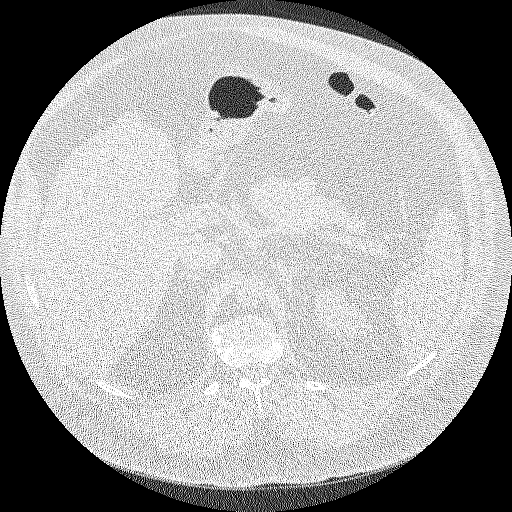
[im 51/352  lung]
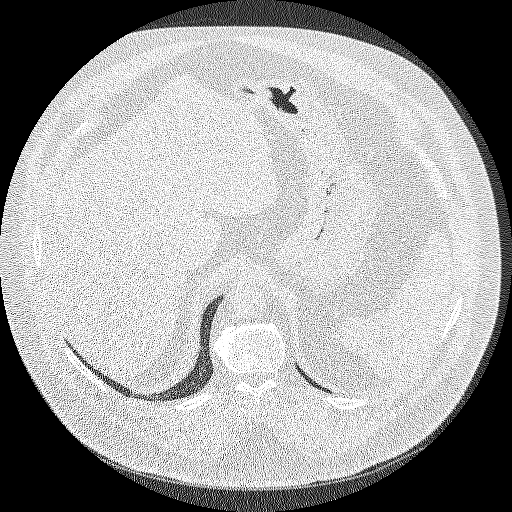
[im 84/352  lung]
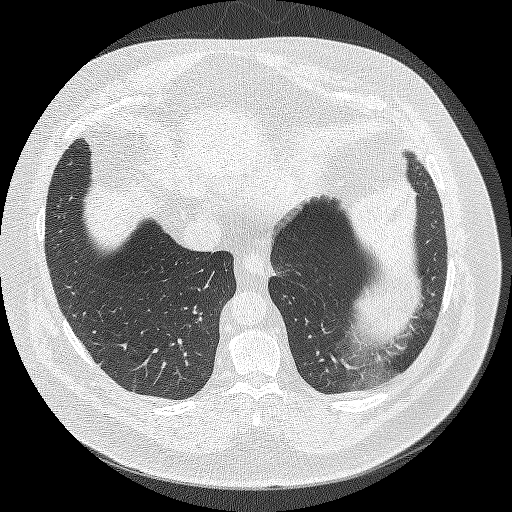
[im 101/352  lung]
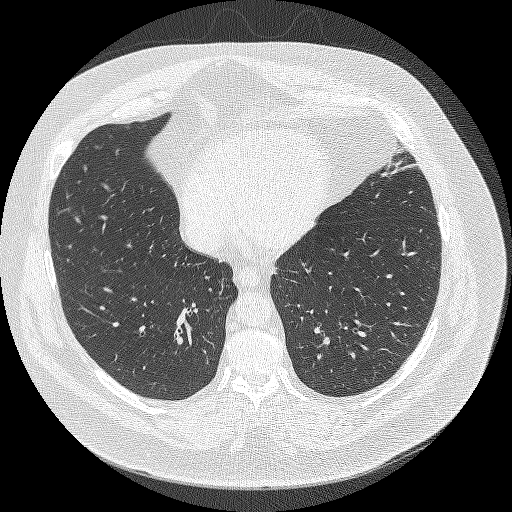
[im 134/352  mediastinal]
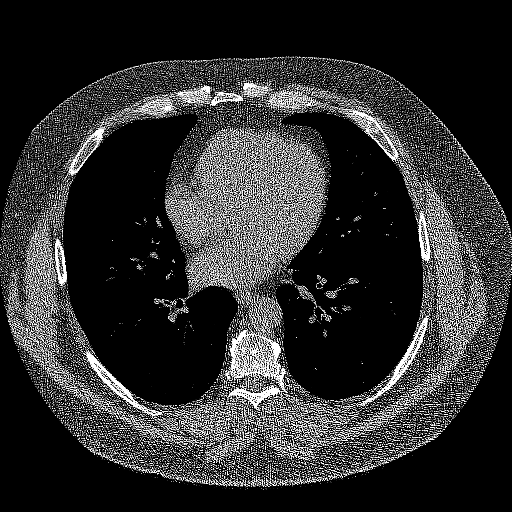
[im 134/352  lung]
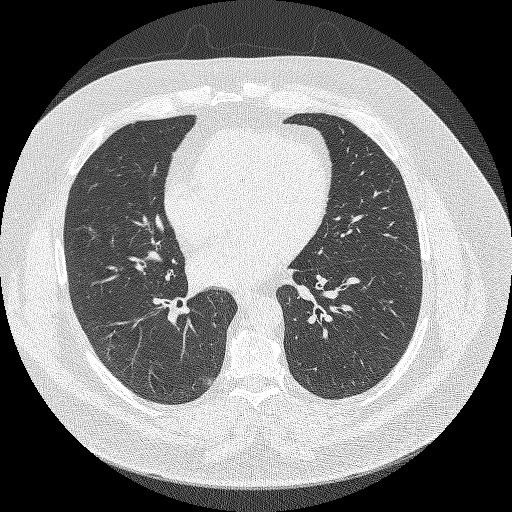
[im 168/352  lung]
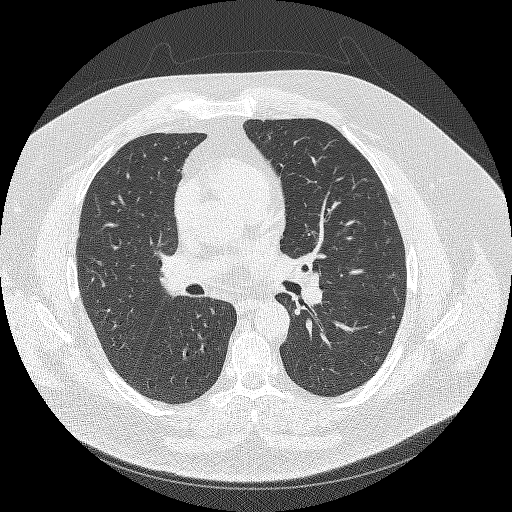
[im 184/352  lung]
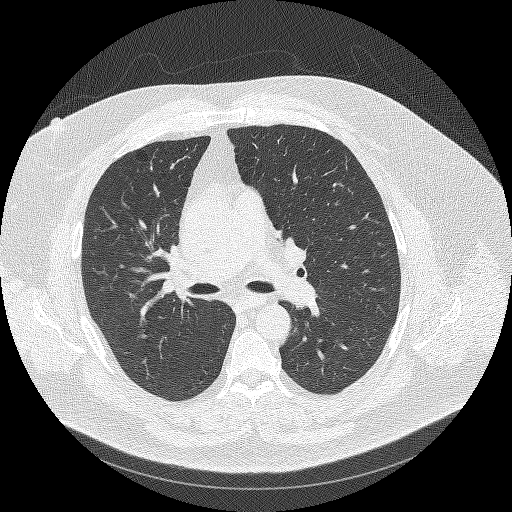
[im 218/352  lung]
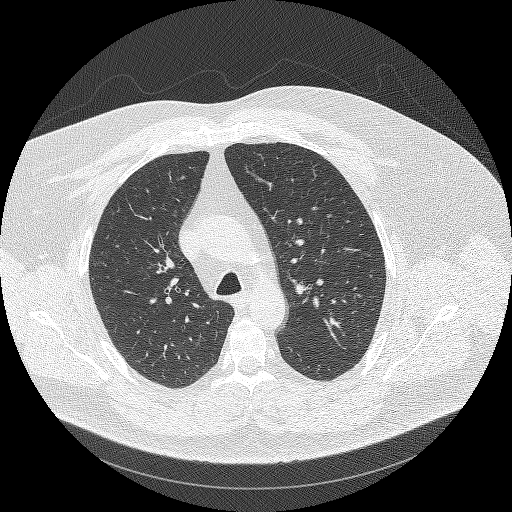
[im 251/352  mediastinal]
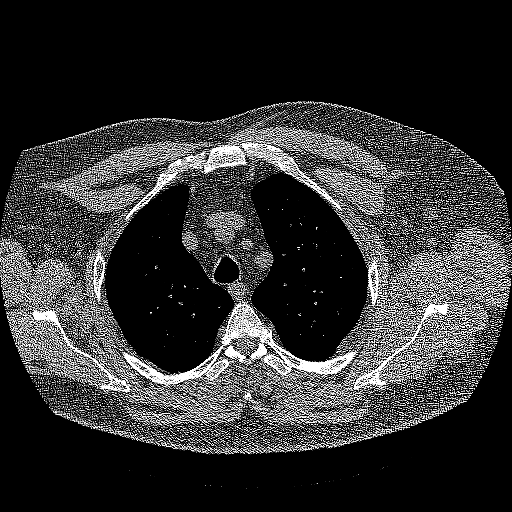
[im 251/352  lung]
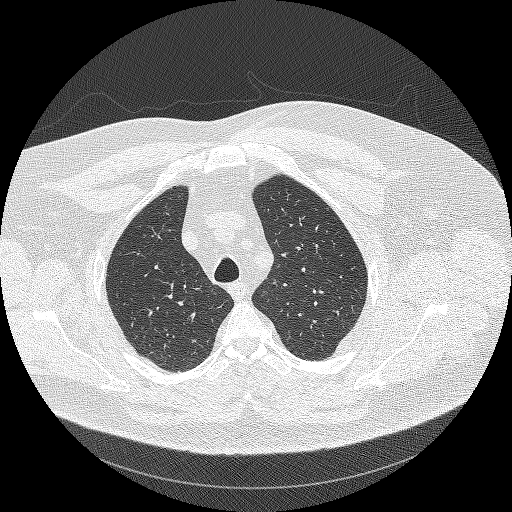
[im 268/352  lung]
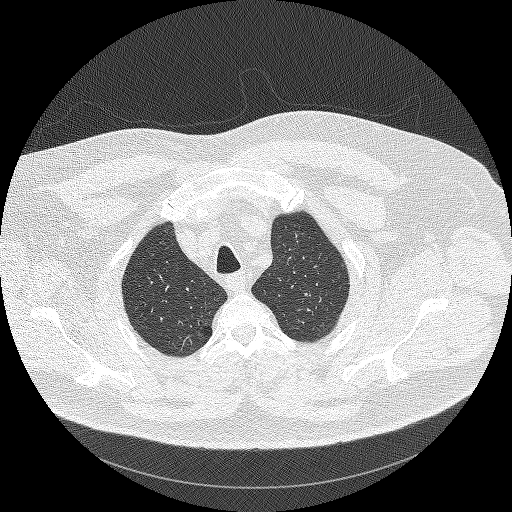
[im 301/352  lung]
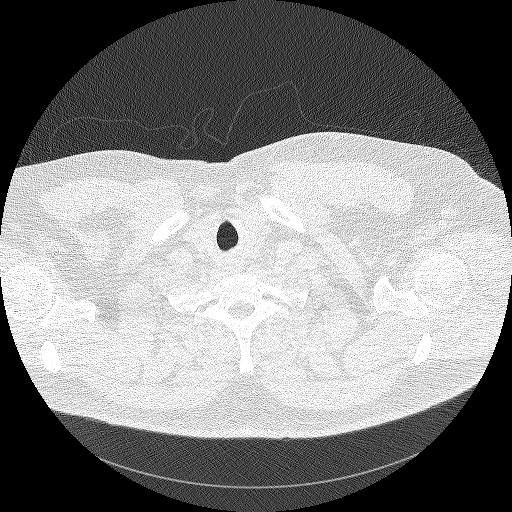
[im 335/352  lung]
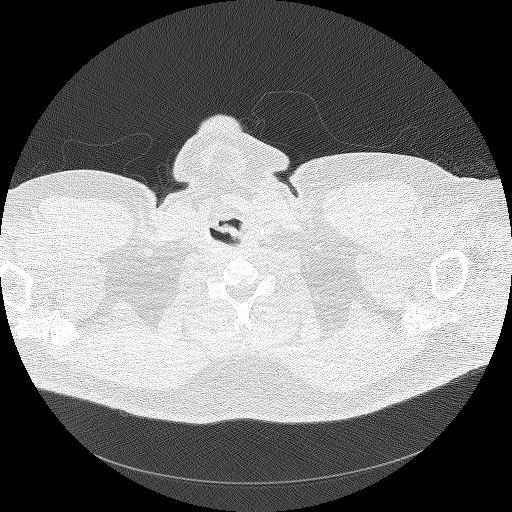

[Series 4: lung · coronal · 0.69mm/px · 3 of 412 slices shown (2 of 2)]
[im 83/412  lung]
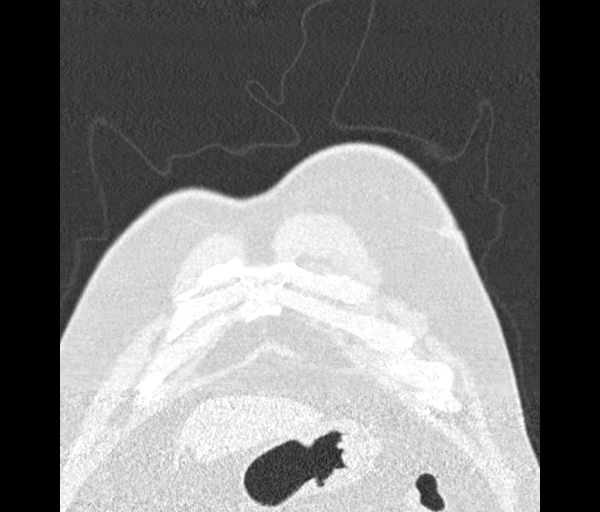
[im 165/412  lung]
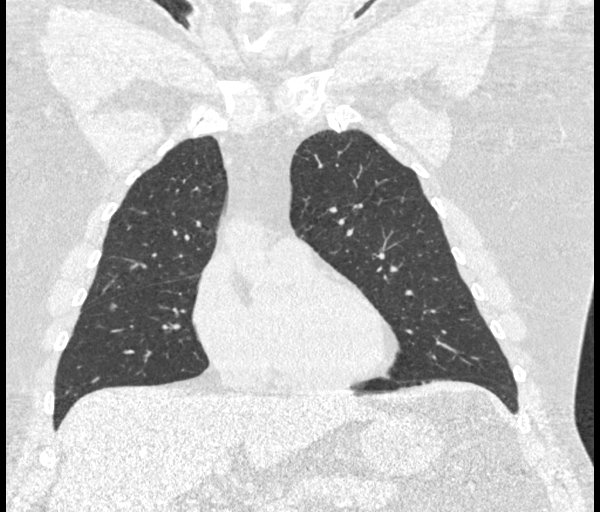
[im 247/412  lung]
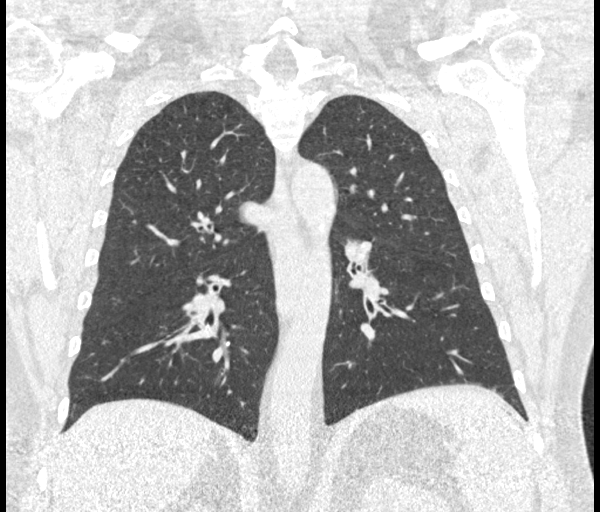

[15 of 40 positions shown; findings below may reference images not displayed]

FINDINGS: Cardiovascular: Aortic atherosclerosis. Tortuous thoracic aorta.
Normal heart size, without pericardial effusion. Lad and right
coronary artery atherosclerosis. Ascending aorta upper normal size,
between 3.9 and 4.0 cm.

Mediastinum/Nodes: No mediastinal or definite hilar adenopathy,
given limitations of unenhanced CT.

Lungs/Pleura: No pleural fluid. Mild bilateral pleural thickening.
Mild centrilobular emphysema. Lower lobe predominant bronchial wall
thickening.

Bilateral pulmonary nodules are primarily subpleural in
distribution. These measure maximally volume derived equivalent
diameter 5.9 mm.

Upper Abdomen: Probable high left hepatic lobe cyst. Normal imaged
portions of the spleen, stomach, pancreas, left adrenal gland and
left kidney. Minimal right adrenal nodularity.

Musculoskeletal: Lower cervical and thoracic spondylosis
IMPRESSION: 1. Lung-RADS 2, benign appearance or behavior. Continue annual
screening with low-dose chest CT without contrast in 12 months.
2. Aortic atherosclerosis (YBAKG-LAB.B), coronary artery
atherosclerosis and emphysema (YBAKG-FLJ.I).
3. Upper normal ascending aortic size. Recommend attention on
follow-up.

## 2020-03-14 ENCOUNTER — Telehealth: Payer: Self-pay | Admitting: *Deleted

## 2020-03-14 DIAGNOSIS — Z87891 Personal history of nicotine dependence: Secondary | ICD-10-CM

## 2020-03-14 DIAGNOSIS — Z122 Encounter for screening for malignant neoplasm of respiratory organs: Secondary | ICD-10-CM

## 2020-03-14 NOTE — Telephone Encounter (Signed)
Contacted and scheduled. Former smoker, quit 10/2018, 92 pack year

## 2020-03-31 ENCOUNTER — Ambulatory Visit: Admission: RE | Admit: 2020-03-31 | Payer: Medicare HMO | Source: Ambulatory Visit

## 2020-04-22 ENCOUNTER — Other Ambulatory Visit: Payer: Self-pay

## 2020-04-22 ENCOUNTER — Ambulatory Visit
Admission: RE | Admit: 2020-04-22 | Discharge: 2020-04-22 | Disposition: A | Discharge status: Home / Self Care | Payer: Medicare HMO | Source: Ambulatory Visit | Attending: Nurse Practitioner | Admitting: Nurse Practitioner

## 2020-04-22 DIAGNOSIS — Z122 Encounter for screening for malignant neoplasm of respiratory organs: Secondary | ICD-10-CM | POA: Insufficient documentation

## 2020-04-22 DIAGNOSIS — Z87891 Personal history of nicotine dependence: Secondary | ICD-10-CM | POA: Insufficient documentation

## 2020-04-25 ENCOUNTER — Encounter: Payer: Self-pay | Admitting: *Deleted

## 2021-04-18 DEATH — deceased

## 2021-08-29 ENCOUNTER — Telehealth: Payer: Self-pay | Admitting: *Deleted

## 2021-08-29 ENCOUNTER — Encounter: Payer: Self-pay | Admitting: *Deleted

## 2021-08-29 NOTE — Telephone Encounter (Signed)
Attempted to contact pt to schedule f/u lung screening CT scan. No answer. Unable to leave a message. Letter mailed to pt asking him to contact us to get this scheduled.  ?
